# Patient Record
Sex: Male | Born: 1995 | Race: White | Hispanic: No | Marital: Single | Smoking: Never smoker
Health system: Southern US, Community
[De-identification: ages and names within clinical notes are randomized; demographics above are authoritative.]

## PROBLEM LIST (undated history)

## (undated) DIAGNOSIS — C4490 Unspecified malignant neoplasm of skin, unspecified: Secondary | ICD-10-CM

## (undated) DIAGNOSIS — K219 Gastro-esophageal reflux disease without esophagitis: Secondary | ICD-10-CM

## (undated) HISTORY — DX: Unspecified malignant neoplasm of skin, unspecified: C44.90

## (undated) HISTORY — PX: HERNIA REPAIR: SHX51

## (undated) HISTORY — DX: Gastro-esophageal reflux disease without esophagitis: K21.9

## (undated) HISTORY — PX: OTHER SURGICAL HISTORY: SHX169

## (undated) HISTORY — PX: WISDOM TOOTH EXTRACTION: SHX21

---
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
## 2017-02-18 ENCOUNTER — Other Ambulatory Visit: Payer: Self-pay | Admitting: Nurse Practitioner

## 2017-02-18 DIAGNOSIS — N5082 Scrotal pain: Secondary | ICD-10-CM

## 2017-02-22 ENCOUNTER — Ambulatory Visit
Admission: RE | Admit: 2017-02-22 | Discharge: 2017-02-22 | Disposition: A | Discharge status: Home / Self Care | Payer: Medicaid Other | Source: Ambulatory Visit | Attending: Nurse Practitioner | Admitting: Nurse Practitioner

## 2017-02-22 DIAGNOSIS — N500 Atrophy of testis: Secondary | ICD-10-CM | POA: Diagnosis not present

## 2017-02-22 DIAGNOSIS — N503 Cyst of epididymis: Secondary | ICD-10-CM | POA: Insufficient documentation

## 2017-02-22 DIAGNOSIS — N5082 Scrotal pain: Secondary | ICD-10-CM | POA: Diagnosis present

## 2017-02-28 ENCOUNTER — Ambulatory Visit (INDEPENDENT_AMBULATORY_CARE_PROVIDER_SITE_OTHER): Payer: Medicaid Other | Admitting: Urology

## 2017-02-28 VITALS — BP 120/81 | HR 69 | Ht 68.0 in | Wt 135.0 lb

## 2017-02-28 DIAGNOSIS — N500 Atrophy of testis: Secondary | ICD-10-CM | POA: Diagnosis not present

## 2017-02-28 DIAGNOSIS — N503 Cyst of epididymis: Secondary | ICD-10-CM | POA: Diagnosis not present

## 2017-02-28 LAB — MICROSCOPIC EXAMINATION
EPITHELIAL CELLS (NON RENAL): NONE SEEN /HPF (ref 0–10)
RBC, UA: NONE SEEN /hpf (ref 0–?)
WBC UA: NONE SEEN /HPF (ref 0–?)

## 2017-02-28 LAB — URINALYSIS, COMPLETE
BILIRUBIN UA: NEGATIVE
Glucose, UA: NEGATIVE
Ketones, UA: NEGATIVE
Leukocytes, UA: NEGATIVE
NITRITE UA: NEGATIVE
PH UA: 6 (ref 5.0–7.5)
RBC UA: NEGATIVE
Specific Gravity, UA: >1.03 — ABNORMAL HIGH (ref 1.005–1.030)
UUROB: 0.2 mg/dL (ref 0.2–1.0)

## 2017-02-28 NOTE — Progress Notes (Signed)
02/28/2017 9:21 AM   Cole Salinas 12/06/1995 [id number]  Referring provider: [place], [place] [address]  No chief complaint on file.   HPI: The patient is a 21 year old gentleman presents today for evaluation of a 2.5 cm left epididymal cyst. Also of note he does have an atrophic right testicle. 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 He recently has had intermittent discomfort in his left scrotum. Sitting and relaxing makes it better. Active movement makes it worse. He denies any history of infections. He denies dysuria. He does have a history of a right inguinal herniorrhaphy as a young child which likely accounts for his right atrophic testicle.   PMH: Past Medical History:  Diagnosis Date  . GERD (gastroesophageal reflux disease)   . Skin cancer 2011   melanoma -removed by plastic surgeon- cant recall name     Surgical History: Past Surgical History:  Procedure Laterality Date  . caterization of nose     per pt-frquent nose bleeds   . HERNIA REPAIR     " as a baby " per pt-does not know age  . WISDOM TOOTH EXTRACTION      Home Medications:  Allergies as of 02/28/2017   No Known Allergies     Medication List       Accurate as of 02/28/17  9:21 AM. Always use your most recent med list.          BENZACLIN gel Generic drug:  clindamycin-benzoyl peroxide Benzaclin 1 %-5 % topical gel   omeprazole 40 MG capsule Commonly known as:  PRILOSEC take 1 capsule by mouth once daily IN THE MORNING 1 HOUR PRIOR TO BREAKFAST       Allergies: No Known Allergies  Family History: Family History  Problem Relation Age of Onset  . Depression Mother   . Autoimmune disease Mother   . Cancer - Other Brother        " blood cancer " per pt    . Other Brother        "iron problem-too high "per pt (thinks )    Social History:  reports that he has never smoked. He uses smokeless tobacco. He reports that  he does not drink alcohol or use drugs.  ROS: UROLOGY Frequent Urination?: No Hard to postpone urination?: No Burning/pain with urination?: No Get up at night to urinate?: No Leakage of urine?: No Urine stream starts and stops?: No Trouble starting stream?: No Do you have to strain to urinate?: No Blood in urine?: No Urinary tract infection?: No Sexually transmitted disease?: No Injury to kidneys or bladder?: No Painful intercourse?: No Weak stream?: No Erection problems?: No Penile pain?: No  Gastrointestinal Nausea?: No Vomiting?: No Indigestion/heartburn?: No Diarrhea?: No Constipation?: No  Constitutional Fever: No Night sweats?: No Weight loss?: No Fatigue?: No  Skin Skin rash/lesions?: No Itching?: No  Eyes Blurred vision?: No Double vision?: No  Ears/Nose/Throat Sore throat?: No Sinus problems?: No  Hematologic/Lymphatic Swollen glands?: No Easy bruising?: No  Cardiovascular Leg swelling?: No Chest pain?: No  Respiratory Cough?: No Shortness of breath?: No  Endocrine Excessive thirst?: No  Musculoskeletal Back pain?: No Joint pain?: No  Neurological Headaches?: No Dizziness?: No  Psychologic Depression?: No Anxiety?: No  Physical Exam: BP 120/81 (BP Location: Right Arm, Patient Position: Sitting, Cuff Size: Normal)   Pulse 69   Ht 5\' 8"  (1.727 m)   Wt 135 lb (61.2 kg)  BMI 20.53 kg/m   Constitutional:  Alert and oriented, No acute distress. HEENT: [place] AT, moist mucus membranes.  Trachea midline, no masses. Cardiovascular: No clubbing, cyanosis, or edema. Respiratory: Normal respiratory effort, no increased work of breathing. GI: Abdomen is soft, nontender, nondistended, no abdominal masses GU: No CVA tenderness. Normal phallus except for minor glandular hypospadias. Right atrophic testicle. Normal left testicle. Aprroximately 2 cm left epididymal cyst appreciated. Overall benign exam. Cyst is nontender to palpation. Skin: No  rashes, bruises or suspicious lesions. Lymph: No cervical or inguinal adenopathy. Neurologic: Grossly intact, no focal deficits, moving all 4 extremities. Psychiatric: Normal mood and affect.  Laboratory Data: Lab Results  Component Value Date   WBC 6.3 11/29/2016   HGB 16.2 11/29/2016   HCT 45.7 11/29/2016   MCV 85.0 11/29/2016   PLT 256 11/29/2016    Lab Results  Component Value Date   CREATININE 0.93 11/29/2016    No results found for: PSA  No results found for: TESTOSTERONE  No results found for: HGBA1C  Urinalysis No results found for: COLORURINE, APPEARANCEUR, LABSPEC, [place], GLUCOSEU, HGBUR, BILIRUBINUR, KETONESUR, PROTEINUR, UROBILINOGEN, NITRITE, LEUKOCYTESUR  Pertinent Imaging: Scrotal ultrasound reviewed as above  Assessment & Plan:   1. Left epididymal cysts 2. Right atrophic testicle I discussed with the patient that his epididymal cysts is incidental benign finding and that these really need to be treated. I also shared my concern with him that if we did remove his left epididymal cyst that due to his atrophic right testicle that he would be infertile. I strongly recommend against any further workup of this cyst especially due to fertility issues.  I have recommended NSAIDs and icing if he has discomfort in his left hemiscrotum. The patient is agreeable. We'll follow up with [place] as needed.  Return if symptoms worsen or fail to improve.  Nickie Retort, MD  [facility] [address], [place], [place] [address] [phone]

## 2017-05-28 ENCOUNTER — Emergency Department: Payer: Medicaid Other

## 2017-05-28 ENCOUNTER — Emergency Department
Admission: EM | Admit: 2017-05-28 | Discharge: 2017-05-28 | Disposition: A | Discharge status: Home / Self Care | Payer: Medicaid Other | Attending: Emergency Medicine | Admitting: Emergency Medicine

## 2017-05-28 DIAGNOSIS — F909 Attention-deficit hyperactivity disorder, unspecified type: Secondary | ICD-10-CM | POA: Diagnosis not present

## 2017-05-28 DIAGNOSIS — S61233A Puncture wound without foreign body of left middle finger without damage to nail, initial encounter: Secondary | ICD-10-CM | POA: Diagnosis not present

## 2017-05-28 DIAGNOSIS — Z79899 Other long term (current) drug therapy: Secondary | ICD-10-CM | POA: Diagnosis not present

## 2017-05-28 DIAGNOSIS — Y929 Unspecified place or not applicable: Secondary | ICD-10-CM | POA: Insufficient documentation

## 2017-05-28 DIAGNOSIS — W298XXA Contact with other powered powered hand tools and household machinery, initial encounter: Secondary | ICD-10-CM | POA: Diagnosis not present

## 2017-05-28 DIAGNOSIS — T148XXA Other injury of unspecified body region, initial encounter: Secondary | ICD-10-CM

## 2017-05-28 DIAGNOSIS — Z23 Encounter for immunization: Secondary | ICD-10-CM | POA: Diagnosis not present

## 2017-05-28 DIAGNOSIS — Y9389 Activity, other specified: Secondary | ICD-10-CM | POA: Insufficient documentation

## 2017-05-28 DIAGNOSIS — Z85820 Personal history of malignant melanoma of skin: Secondary | ICD-10-CM | POA: Insufficient documentation

## 2017-05-28 DIAGNOSIS — Y999 Unspecified external cause status: Secondary | ICD-10-CM | POA: Insufficient documentation

## 2017-05-28 MED ORDER — NAPROXEN 500 MG PO TABS: 500.0000 mg | ORAL_TABLET | Freq: Two times a day (BID) | ORAL | 0 refills | Status: DC

## 2017-05-28 MED ORDER — BACITRACIN ZINC 500 UNIT/GM EX OINT: TOPICAL_OINTMENT | Freq: Two times a day (BID) | CUTANEOUS | Status: DC

## 2017-05-28 MED ORDER — SULFAMETHOXAZOLE-TRIMETHOPRIM 800-160 MG PO TABS: 1.0000 | ORAL_TABLET | Freq: Two times a day (BID) | ORAL | 0 refills | Status: DC

## 2017-05-28 MED ORDER — TETANUS-DIPHTH-ACELL PERTUSSIS 5-2.5-18.5 LF-MCG/0.5 IM SUSP
0.5000 mL | Freq: Once | INTRAMUSCULAR | Status: AC
  Administered 2017-05-28: 0.5 mL via INTRAMUSCULAR
  Filled 2017-05-28: qty 0.5

## 2017-05-28 NOTE — Discharge Instructions (Signed)
Finger splint for 2-3 days as directed. Take medication as directed.

## 2017-05-28 NOTE — ED Notes (Signed)
FIRST NURSE NOTE: pt ambulatory to ER via POV. Left hand 3rd digit injury, wrapped upon arrival. Pt in NAD.

## 2017-05-28 NOTE — ED Provider Notes (Signed)
[facility] Emergency Department Provider Note   ____________________________________________   First MD Initiated Contact with Patient 05/28/17 1234     (approximate)  I have reviewed the triage vital signs and the nursing notes.   HISTORY  Chief Complaint No chief complaint on file.    HPI Cole Salinas is a 22 y.o. male patient present with entrance and exit puncture wound to the left middle finger. Patient was using a drill when it slipped causingpuncture wound to the proximal phalange of the third digit left hand. Patient denies loss sensation or loss of function of the finger. Patient unsure last tetanus shot. Patient is right-hand dominant.   Past Medical History:  Diagnosis Date  . GERD (gastroesophageal reflux disease)   . Skin cancer 2011   melanoma -removed by plastic surgeon- cant recall name     Patient Active Problem List   Diagnosis Date Noted  . Epididymal cyst 01/04/2015  . Cyst of epididymis 01/04/2015  . 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 Attention and concentration deficit 10/01/2014  . 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 Seasonal allergic rhinitis 03/15/2014    Past Surgical History:  Procedure Laterality Date  . caterization of nose     per pt-frquent nose bleeds   . HERNIA REPAIR     " as a baby " per pt-does not know age  . WISDOM TOOTH EXTRACTION      Prior to Admission medications   Medication Sig Start Date End Date Taking? Authorizing Provider  clindamycin-benzoyl peroxide (BENZACLIN) gel Benzaclin 1 %-5 % topical gel    [provider]  naproxen (NAPROSYN) 500 MG tablet Take 1 tablet (500 mg total) by mouth 2 (two) times daily with a meal. 05/28/17   Sable Feil, PA-C  omeprazole (PRILOSEC) 40 MG capsule take 1 capsule by mouth once daily IN THE MORNING 1 HOUR PRIOR TO BREAKFAST 11/07/16   [provider]  sulfamethoxazole-trimethoprim (BACTRIM  DS,SEPTRA DS) 800-160 MG tablet Take 1 tablet by mouth 2 (two) times daily. 05/28/17   Sable Feil, PA-C    Allergies Patient has no known allergies.  Family History  Problem Relation Age of Onset  . Depression Mother   . Autoimmune disease Mother   . Cancer - Other Brother        " blood cancer " per pt    . Other Brother        "iron problem-too high "per pt (thinks )    Social History Social History   Tobacco Use  . Smoking status: Never Smoker  . Smokeless tobacco: Current User  Substance Use Topics  . Alcohol use: No  . Drug use: No    Review of Systems Constitutional: No fever/chills Eyes: No visual changes. ENT: No sore throat. Cardiovascular: Denies chest pain. Respiratory: Denies shortness of breath. Gastrointestinal: No abdominal pain.  No nausea, no vomiting.  No diarrhea.  No constipation. Genitourinary: Negative for dysuria. Musculoskeletal: Negative for back pain. Skin: Puncture wound third digit left hand Neurological: Negative for headaches, focal weakness or numbness.   ____________________________________________   PHYSICAL EXAM:  VITAL SIGNS: ED Triage Vitals [05/28/17 1220]  Enc Vitals Group     BP (!) 138/91     Pulse Rate 88     Resp 20     Temp 98.2 F (36.8 C)     Temp Source Oral     SpO2 100 %     Weight 135 lb (61.2  kg)     Height 5\' 8"  (1.727 m)     Head Circumference      Peak Flow      Pain Score      Pain Loc      Pain Edu?      Excl. in [place]?    Constitutional: Alert and oriented. Well appearing and in no acute distress. Cardiovascular: Normal rate, regular rhythm. Grossly normal heart sounds.  Good peripheral circulation. Respiratory: Normal respiratory effort.  No retractions. Lungs CTAB. Musculoskeletal: deformity to the third digit left hand. Patient has full nuchal range of motion with flexion and extension. Neurologic:  Normal speech and language. No gross focal neurologic deficits are appreciated. No gait  instability. Skin:  Skin is warm, dry and intact. No rash noted. Entrance exit small puncture wound to the third digit left hand. Psychiatric: Mood and affect are normal. Speech and behavior are normal.  ____________________________________________   LABS (all labs ordered are listed, but only abnormal results are displayed)  Labs Reviewed - No data to display ____________________________________________  EKG   ____________________________________________  RADIOLOGY  Dg Finger Middle Left  Result Date: 05/28/2017 CLINICAL DATA:  Puncture wound with screw EXAM: LEFT MIDDLE FINGER 2+V COMPARISON:  None. FINDINGS: There is no evidence of fracture or dislocation. There is no evidence of arthropathy or other focal bone abnormality. Soft tissues are unremarkable. IMPRESSION: Negative. Electronically Signed   By: Franchot Gallo M.D.   On: 05/28/2017 12:56    ____________________________________________   PROCEDURES  Procedure(s) performed: None  Procedures  Critical Care performed: No  ____________________________________________   INITIAL IMPRESSION / ASSESSMENT AND PLAN / ED COURSE  As part of my medical decision making, I reviewed the following data within the [facility]    Puncture wound third digit left hand. Discussed negative x-ray finding with patient. Patient given discharge care instructions. Patient advised to take medication as directed.      ____________________________________________   FINAL CLINICAL IMPRESSION(S) / ED DIAGNOSES  Final diagnoses:  Puncture wound     ED Discharge Orders        Ordered    naproxen (NAPROSYN) 500 MG tablet  2 times daily with meals     05/28/17 1306    sulfamethoxazole-trimethoprim (BACTRIM DS,SEPTRA DS) 800-160 MG tablet  2 times daily     05/28/17 1306       Note:  This document was prepared using Dragon voice recognition software and may include unintentional dictation errors.    Sable Feil, PA-C 05/28/17 1307    Carrie Mew, MD 05/28/17 1535

## 2017-06-18 ENCOUNTER — Encounter: Payer: Self-pay | Admitting: Oncology

## 2017-06-18 ENCOUNTER — Inpatient Hospital Stay: Payer: Medicaid Other | Attending: Oncology

## 2017-06-18 ENCOUNTER — Inpatient Hospital Stay (HOSPITAL_BASED_OUTPATIENT_CLINIC_OR_DEPARTMENT_OTHER): Payer: Medicaid Other | Admitting: Oncology

## 2017-06-18 LAB — IRON AND TIBC
IRON: 116 ug/dL (ref 45–182)
Saturation Ratios: 37 % (ref 17.9–39.5)
TIBC: 310 ug/dL (ref 250–450)
UIBC: 194 ug/dL

## 2017-06-18 LAB — CBC WITH DIFFERENTIAL/PLATELET
Basophils Absolute: 0.1 10*3/uL (ref 0–0.1)
Basophils Relative: 1 %
EOS PCT: 3 %
Eosinophils Absolute: 0.2 10*3/uL (ref 0–0.7)
HEMATOCRIT: 45.1 % (ref 40.0–52.0)
HEMOGLOBIN: 15.6 g/dL (ref 13.0–18.0)
LYMPHS ABS: 1.9 10*3/uL (ref 1.0–3.6)
LYMPHS PCT: 32 %
MCH: 29.9 pg (ref 26.0–34.0)
MCHC: 34.6 g/dL (ref 32.0–36.0)
MCV: 86.6 fL (ref 80.0–100.0)
Monocytes Absolute: 0.5 10*3/uL (ref 0.2–1.0)
Monocytes Relative: 8 %
NEUTROS ABS: 3.3 10*3/uL (ref 1.4–6.5)
Neutrophils Relative %: 56 %
Platelets: 240 10*3/uL (ref 150–440)
RBC: 5.21 MIL/uL (ref 4.40–5.90)
RDW: 13 % (ref 11.5–14.5)
WBC: 5.9 10*3/uL (ref 3.8–10.6)

## 2017-06-18 LAB — COMPREHENSIVE METABOLIC PANEL
ALK PHOS: 72 U/L (ref 38–126)
ALT: 37 U/L (ref 17–63)
AST: 28 U/L (ref 15–41)
Albumin: 4.5 g/dL (ref 3.5–5.0)
Anion gap: 9 (ref 5–15)
BILIRUBIN TOTAL: 1.2 mg/dL (ref 0.3–1.2)
BUN: 16 mg/dL (ref 6–20)
CALCIUM: 9.2 mg/dL (ref 8.9–10.3)
CO2: 22 mmol/L (ref 22–32)
CREATININE: 0.96 mg/dL (ref 0.61–1.24)
Chloride: 107 mmol/L (ref 101–111)
Glucose, Bld: 98 mg/dL (ref 65–99)
Potassium: 4.2 mmol/L (ref 3.5–5.1)
Sodium: 138 mmol/L (ref 135–145)
TOTAL PROTEIN: 7.1 g/dL (ref 6.5–8.1)

## 2017-06-18 LAB — FERRITIN: FERRITIN: 110 ng/mL (ref 24–336)

## 2017-06-18 LAB — TSH: TSH: 1.242 u[IU]/mL (ref 0.350–4.500)

## 2017-06-18 NOTE — Progress Notes (Signed)
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 His identical twin brother as well as his mother have been found to have hemochromatosis and are under the care of Dr. Grayland Ormond.  They have not required any phlebotomy yet.  Results of blood work from 11/29/2016 were as follows: CBC showed white count of 6.3, H&H of 16.2/45.7 and a platelet count of 256. CMP was normal except for a mildly elevated bilirubin of 1.5. Ferritin was normal at 121. Iron studies showed an elevated iron saturation of 60%. HFE gene testing revealed single mutation in C282Y. Patients grandfather also has [place] and has been getting periodic phlebotomy per patient   Interval history- doing well.  Denies any fatigue, joint pains or joint swelling, problems with sexual function  ECOG PS- 0 Pain scale- 0   Review of systems- Review of Systems  Constitutional: Negative for chills, fever, malaise/fatigue and weight loss.  HENT: Negative for congestion, ear discharge and nosebleeds.   Eyes: Negative for blurred vision.  Respiratory: Negative for cough, hemoptysis, sputum production, shortness of breath and wheezing.   Cardiovascular: Negative for chest pain, palpitations, orthopnea and claudication.  Gastrointestinal: Negative for abdominal pain, blood in stool, constipation, diarrhea, heartburn, melena, nausea and vomiting.  Genitourinary: Negative for dysuria, flank pain, frequency, hematuria and urgency.  Musculoskeletal: Negative for back pain, joint pain and myalgias.  Skin: Negative for  rash.  Neurological: Negative for dizziness, tingling, focal weakness, seizures, weakness and headaches.  Endo/Heme/Allergies: Does not bruise/bleed easily.  Psychiatric/Behavioral: Negative for depression and suicidal ideas. The patient does not have insomnia.      No Known Allergies   Past Medical History:  Diagnosis Date  . GERD (gastroesophageal reflux disease)   . Skin cancer 2011   melanoma -removed by plastic surgeon- cant recall name      Past Surgical History:  Procedure Laterality Date  . caterization of nose     per pt-frquent nose bleeds   . HERNIA REPAIR     " as a baby " per pt-does not know age  . WISDOM TOOTH EXTRACTION      Social History   Socioeconomic History  . Marital status: Single    Spouse name: Not on file  . Number of children: Not on file  . Years of education: Not on file  . Highest education level: Not on file  Social Needs  . Financial resource strain: Not on file  . Food insecurity - worry: Not on file  . Food insecurity - inability: Not on file  . Transportation needs - medical: Not on file  . Transportation needs - non-medical: Not on file  Occupational History  . Not on file  Tobacco Use  . Smoking status: Never Smoker  . Smokeless tobacco: Current User  Substance and Sexual Activity  . Alcohol use: No  . Drug use: No  . Sexual activity: Yes  Other Topics Concern  . Not on file  Social History Narrative  . Not on file    Family History  Problem Relation Age of Onset  . Depression Mother   .  Autoimmune disease Mother   . Cancer - Other Brother        " blood cancer " per pt    . Other Brother        "iron problem-too high "per pt (thinks )     Current Outpatient Medications:  .  clindamycin-benzoyl peroxide (BENZACLIN) gel, Benzaclin 1 %-5 % topical gel, Disp: , Rfl:  .  naproxen (NAPROSYN) 500 MG tablet, Take 1 tablet (500 mg total) by mouth 2 (two) times daily with a meal., Disp: 20 tablet, Rfl: 0 .  omeprazole  (PRILOSEC) 40 MG capsule, take 1 capsule by mouth once daily IN THE MORNING 1 HOUR PRIOR TO BREAKFAST, Disp: , Rfl: 0 .  sulfamethoxazole-trimethoprim (BACTRIM DS,SEPTRA DS) 800-160 MG tablet, Take 1 tablet by mouth 2 (two) times daily., Disp: 20 tablet, Rfl: 0  Physical exam:  Vitals:   06/18/17 1149  BP: 121/78  Pulse: 79  Resp: 14  Temp: (!) 97.3 F (36.3 C)  TempSrc: Tympanic  Weight: 138 lb (62.6 kg)   Physical Exam  Constitutional: He is oriented to person, place, and time and well-developed, well-nourished, and in no distress.  HENT:  Head: Normocephalic and atraumatic.  Eyes: EOM are normal. Pupils are equal, round, and reactive to light.  Neck: Normal range of motion.  Cardiovascular: Normal rate, regular rhythm and normal heart sounds.  Pulmonary/Chest: Effort normal and breath sounds normal.  Abdominal: Soft. Bowel sounds are normal.  Musculoskeletal:  No evidence of joint swelling  Neurological: He is alert and oriented to person, place, and time.  Skin: Skin is warm and dry.     CMP Latest Ref Rng & Units 06/18/2017  Glucose 65 - 99 mg/dL 98  BUN 6 - 20 mg/dL 16  Creatinine 0.61 - 1.24 mg/dL 0.96  Sodium 135 - 145 mmol/L 138  Potassium 3.5 - 5.1 mmol/L 4.2  Chloride 101 - 111 mmol/L 107  CO2 22 - 32 mmol/L 22  Calcium 8.9 - 10.3 mg/dL 9.2  Total Protein 6.5 - 8.1 g/dL 7.1  Total Bilirubin 0.3 - 1.2 mg/dL 1.2  Alkaline Phos 38 - 126 U/L 72  AST 15 - 41 U/L 28  ALT 17 - 63 U/L 37   CBC Latest Ref Rng & Units 06/18/2017  WBC 3.8 - 10.6 K/uL 5.9  Hemoglobin 13.0 - 18.0 g/dL 15.6  Hematocrit 40.0 - 52.0 % 45.1  Platelets 150 - 440 K/uL 240    No images are attached to the encounter.  Dg Finger Middle Left  Result Date: 05/28/2017 CLINICAL DATA:  Puncture wound with screw EXAM: LEFT MIDDLE FINGER 2+V COMPARISON:  None. FINDINGS: There is no evidence of fracture or dislocation. There is no evidence of arthropathy or other focal bone abnormality. Soft tissues  are unremarkable. IMPRESSION: Negative. Electronically Signed   By: Franchot Gallo M.D.   On: 05/28/2017 12:56     Assessment and plan- Patient is a 22 y.o. 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  Ferritin levels are pending.  In the past patient has had an elevated iron saturation of 60% but ferritin levels were normal at 121.  Clinically patient does not have any symptoms of iron overload.  I will repeat CBC and CMP along with iron studies in 6 months in 1 year and I will see him back in 1 year   Visit Diagnosis 1. Hereditary hemochromatosis ([place])      Dr. Randa Evens,  MD, MPH [place] [facility] Pager- [phone] 06/18/2017 12:14 PM

## 2017-09-08 ENCOUNTER — Ambulatory Visit
Admission: EM | Admit: 2017-09-08 | Discharge: 2017-09-08 | Disposition: A | Discharge status: Home / Self Care | Payer: Medicaid Other | Attending: Emergency Medicine | Admitting: Emergency Medicine

## 2017-09-08 DIAGNOSIS — J069 Acute upper respiratory infection, unspecified: Secondary | ICD-10-CM

## 2017-09-08 MED ORDER — GUAIFENESIN-CODEINE 100-10 MG/5ML PO SYRP: 5.0000 mL | ORAL_SOLUTION | Freq: Three times a day (TID) | ORAL | 0 refills | Status: DC | PRN

## 2017-09-08 MED ORDER — FEXOFENADINE HCL 180 MG PO TABS: 180.0000 mg | ORAL_TABLET | Freq: Every day | ORAL | 0 refills | Status: DC

## 2017-09-08 MED ORDER — FLUTICASONE PROPIONATE 50 MCG/ACT NA SUSP: 2.0000 | Freq: Every day | NASAL | 0 refills | Status: DC

## 2017-09-08 MED ORDER — BENZONATATE 200 MG PO CAPS: ORAL_CAPSULE | ORAL | 0 refills | Status: DC

## 2017-09-08 NOTE — ED Triage Notes (Signed)
Pt here for cough and sore throat. Has been hurting since Thursday. No reports of fever and no otc medications tried.

## 2017-09-08 NOTE — ED Provider Notes (Signed)
[facility]    CSN: [id number] Arrival date & time: 09/08/17  1334     History   Chief Complaint Chief Complaint  Patient presents with  . Cough    HPI Cole Salinas is a 22 y.o. male.   HPI  21 year old male presents with cough and sore throat has had since Thursday 3 days prior to this visit.  He has had no fever or chills.  He is not tried any over-the-counter medications.  He has had a stuffy nose.  He is a non-smoker but does vape.  States his boss and his girlfriend have both had very similar symptoms.  He had a difficult time sleeping last night due to the cough.        Past Medical History:  Diagnosis Date  . GERD (gastroesophageal reflux disease)   . Skin cancer 2011   melanoma -removed by plastic surgeon- cant recall name     Patient Active Problem List   Diagnosis Date Noted  . Epididymal cyst 01/04/2015  . Cyst of epididymis 01/04/2015  . 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 Attention and concentration deficit 10/01/2014  . 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 Seasonal allergic rhinitis 03/15/2014    Past Surgical History:  Procedure Laterality Date  . caterization of nose     per pt-frquent nose bleeds   . HERNIA REPAIR     " as a baby " per pt-does not know age  . WISDOM TOOTH EXTRACTION         Home Medications    Prior to Admission medications   Medication Sig Start Date End Date Taking? Authorizing Provider  clindamycin-benzoyl peroxide (BENZACLIN) gel Benzaclin 1 %-5 % topical gel   Yes [provider]  omeprazole (PRILOSEC) 40 MG capsule take 1 capsule by mouth once daily IN THE MORNING 1 HOUR PRIOR TO BREAKFAST 11/07/16  Yes [provider]  benzonatate (TESSALON) 200 MG capsule Take one cap TID PRN cough 09/08/17   Lorin Picket, PA-C  fexofenadine (ALLEGRA ALLERGY) 180 MG tablet Take 1 tablet (180 mg total) by mouth daily. 09/08/17   Lorin Picket, PA-C    fluticasone (FLONASE) 50 MCG/ACT nasal spray Place 2 sprays into both nostrils daily. 09/08/17   Lorin Picket, PA-C  guaiFENesin-codeine (CHERATUSSIN AC) 100-10 MG/5ML syrup Take 5 mLs by mouth 3 (three) times daily as needed for cough. 09/08/17   Lorin Picket, PA-C    Family History Family History  Problem Relation Age of Onset  . Depression Mother   . Autoimmune disease Mother   . Cancer - Other Brother        " blood cancer " per pt    . Other Brother        "iron problem-too high "per pt (thinks )    Social History Social History   Tobacco Use  . Smoking status: Never Smoker  . Smokeless tobacco: Current User  Substance Use Topics  . Alcohol use: No  . Drug use: No     Allergies   Patient has no known allergies.   Review of Systems Review of Systems  Constitutional: Positive for activity change. Negative for chills, fatigue and fever.  HENT: Positive for congestion, postnasal drip, rhinorrhea, sinus pressure, sinus pain and sore throat.   All other systems reviewed and are negative.    Physical Exam Triage Vital Signs ED Triage Vitals  Enc Vitals Group     BP 09/08/17 1344 130/79  Pulse Rate 09/08/17 1344 88     Resp 09/08/17 1344 18     Temp 09/08/17 1344 98.4 F (36.9 C)     Temp Source 09/08/17 1344 Oral     SpO2 09/08/17 1344 100 %     Weight --      Height --      Head Circumference --      Peak Flow --      Pain Score 09/08/17 1345 4     Pain Loc --      Pain Edu? --      Excl. in [place]? --    No data found.  Updated Vital Signs BP 130/79 (BP Location: Right Arm)   Pulse 88   Temp 98.4 F (36.9 C) (Oral)   Resp 18   SpO2 100%   Visual Acuity Right Eye Distance:   Left Eye Distance:   Bilateral Distance:    Right Eye Near:   Left Eye Near:    Bilateral Near:     Physical Exam  Constitutional: He is oriented to person, place, and time. He appears well-developed and well-nourished. No distress.  HENT:  Head:  Normocephalic.  Right Ear: External ear normal.  Left Ear: External ear normal.  Nose: Nose normal.  Mouth/Throat: Oropharynx is clear and moist. No oropharyngeal exudate.  Eyes: Pupils are equal, round, and reactive to light. Right eye exhibits no discharge. Left eye exhibits no discharge.  Neck: Normal range of motion.  Pulmonary/Chest: Effort normal and breath sounds normal.  Musculoskeletal: Normal range of motion.  Lymphadenopathy:    He has no cervical adenopathy.  Neurological: He is alert and oriented to person, place, and time.  Skin: Skin is warm and dry. He is not diaphoretic.  Psychiatric: He has a normal mood and affect. His behavior is normal. Judgment and thought content normal.  Nursing note and vitals reviewed.    UC Treatments / Results  Labs (all labs ordered are listed, but only abnormal results are displayed) Labs Reviewed - No data to display  EKG None Radiology No results found.  Procedures Procedures (including critical care time)  Medications Ordered in UC Medications - No data to display   Initial Impression / Assessment and Plan / UC Course  I have reviewed the triage vital signs and the nursing notes.  Pertinent labs & imaging results that were available during my care of the patient were reviewed by me and considered in my medical decision making (see chart for details).     Plan: 1. Test/x-ray results and diagnosis reviewed with patient 2. rx as per orders; risks, benefits, potential side effects reviewed with patient 3. Recommend supportive treatment with rest and fluids.  Treat with cough suppressants.  Because his job entails working on [employer] most of the day I have cautioned him to not use the Cheratussin during working hours.  He should also not drive while taking the medication.  If he is not improving or worsens he should follow-up with his primary care physician. 4. F/u prn if symptoms worsen or don't improve   Final Clinical  Impressions(s) / UC Diagnoses   Final diagnoses:  Upper respiratory tract infection, unspecified type    ED Discharge Orders        Ordered    fluticasone (FLONASE) 50 MCG/ACT nasal spray  Daily     09/08/17 1408    benzonatate (TESSALON) 200 MG capsule     09/08/17 1408    guaiFENesin-codeine (CHERATUSSIN AC) 100-10  MG/5ML syrup  3 times daily PRN     09/08/17 1408    fexofenadine (ALLEGRA ALLERGY) 180 MG tablet  Daily     09/08/17 1408       Controlled Substance Prescriptions [place] Controlled Substance Registry consulted? Not Applicable   Lorin Picket, PA-C 09/08/17 1418

## 2017-11-20 ENCOUNTER — Ambulatory Visit: Payer: Medicaid Other | Admitting: Gastroenterology

## 2017-11-20 ENCOUNTER — Encounter: Payer: Self-pay | Admitting: Gastroenterology

## 2017-11-20 ENCOUNTER — Other Ambulatory Visit
Admission: RE | Admit: 2017-11-20 | Discharge: 2017-11-20 | Disposition: A | Discharge status: Home / Self Care | Payer: Medicaid Other | Source: Ambulatory Visit | Attending: Gastroenterology | Admitting: Gastroenterology

## 2017-11-20 ENCOUNTER — Encounter (INDEPENDENT_AMBULATORY_CARE_PROVIDER_SITE_OTHER): Payer: Self-pay

## 2017-11-20 VITALS — BP 110/68 | HR 87 | Ht 68.0 in | Wt 143.2 lb

## 2017-11-20 DIAGNOSIS — R1013 Epigastric pain: Secondary | ICD-10-CM | POA: Diagnosis not present

## 2017-11-20 MED ORDER — OMEPRAZOLE 40 MG PO CPDR: DELAYED_RELEASE_CAPSULE | ORAL | 0 refills | Status: DC

## 2017-11-20 NOTE — Progress Notes (Signed)
Jonathon Bellows MD, MRCP(U.K) [address]  [place]  [place], [place] [address]  Main: [phone]  Fax: [phone]   Gastroenterology Consultation  Referring Provider:     Danelle Berry, NP Primary Care Physician:  [place] Primary Gastroenterologist:  Dr. Jonathon Bellows  Reason for Consultation:     Abdominal pain.         HPI:   Cole Salinas is a 22 y.o. y/o male referred for consultation & management  by Dr. [facility]     Abdominal pain: Onset: Few years, on and off, lasts for a few months, then has none then returns. On and off during the day  Site :Describes it as a burning sensatrion all over the belly  Radiation: localized.  Severity :mild - affects eating  Petra Kuba of pain: burning  Aggravating factors: eating - begins as soon as he is done with his meals - lasts till he throws up  Relieving factors :nothing  Weight loss: up and down  NSAID use: no , no THC  PPI use :not taking  Gall bladder surgery: no  Frequency of bowel movements: daily upto 3 times day  Change in bowel movements: no  Relief with bowel movements: no  Gas/Bloating/Abdominal distension: no .    Past Medical History:  Diagnosis Date  . GERD (gastroesophageal reflux disease)   . Skin cancer 2011   melanoma -removed by plastic surgeon- cant recall name     Past Surgical History:  Procedure Laterality Date  . caterization of nose     per pt-frquent nose bleeds   . HERNIA REPAIR     " as a baby " per pt-does not know age  . WISDOM TOOTH EXTRACTION      Prior to Admission medications   Medication Sig Start Date End Date Taking? Authorizing Provider  omeprazole (PRILOSEC) 40 MG capsule take 1 capsule by mouth once daily IN THE MORNING 1 HOUR PRIOR TO BREAKFAST 11/07/16  Yes [provider]  benzonatate (TESSALON) 200 MG capsule Take one cap TID PRN cough Patient not taking: Reported on 11/20/2017 09/08/17   Lorin Picket, PA-C  clindamycin-benzoyl  peroxide (BENZACLIN) gel Benzaclin 1 %-5 % topical gel    [provider]  fexofenadine (ALLEGRA ALLERGY) 180 MG tablet Take 1 tablet (180 mg total) by mouth daily. Patient not taking: Reported on 11/20/2017 09/08/17   Crecencio Mc P, PA-C  fluticasone [facility]) 50 MCG/ACT nasal spray Place 2 sprays into both nostrils daily. Patient not taking: Reported on 11/20/2017 09/08/17   Lorin Picket, PA-C  guaiFENesin-codeine (CHERATUSSIN AC) 100-10 MG/5ML syrup Take 5 mLs by mouth 3 (three) times daily as needed for cough. Patient not taking: Reported on 11/20/2017 09/08/17   Lorin Picket, PA-C    Family History  Problem Relation Age of Onset  . Depression Mother   . Autoimmune disease Mother   . Cancer - Other Brother        " blood cancer " per pt    . Other Brother        "iron problem-too high "per pt (thinks )     Social History   Tobacco Use  . Smoking status: Never Smoker  . Smokeless tobacco: Current User  Substance Use Topics  . Alcohol use: No  . Drug use: No    Allergies as of 11/20/2017  . (No Known Allergies)    Review of Systems:    All systems reviewed and negative  except where noted in HPI.   Physical Exam:  BP 110/68   Pulse 87   Ht 5\' 8"  (1.727 m)   Wt 143 lb 3.2 oz (65 kg)   BMI 21.77 kg/m  No LMP for male patient. Psych:  Alert and cooperative. Normal mood and affect. General:   Alert,  Well-developed, well-nourished, pleasant and cooperative in NAD Head:  Normocephalic and atraumatic. Eyes:  Sclera clear, no icterus.   Conjunctiva pink. Ears:  Normal auditory acuity. Nose:  No deformity, discharge, or lesions. Mouth:  No deformity or lesions,oropharynx pink & moist. Neck:  Supple; no masses or thyromegaly. Lungs:  Respirations even and unlabored.  Clear throughout to auscultation.   No wheezes, crackles, or rhonchi. No acute distress. Heart:  Regular rate and rhythm; no murmurs, clicks, rubs, or gallops. Abdomen:  Normal bowel sounds.   No bruits.  Soft, non-tender and non-distended without masses, hepatosplenomegaly or hernias noted.  No guarding or rebound tenderness.    Neurologic:  Alert and oriented x3;  grossly normal neurologically. Skin:  Intact without significant lesions or rashes. No jaundice. Lymph Nodes:  No significant cervical adenopathy. Psych:  Alert and cooperative. Normal mood and affect.  Imaging Studies: No results found.  Assessment and Plan:   Zymir Napoli is a 22 y.o. y/o male has been referred for abdominal pain- long standing - suggestive of dyspepsia.   Plan  1. Stool H pylori antigen  2. Trial pf PPI 3. If no better then will need EGD.   Follow up in 4 weeks  Dr Jonathon Bellows MD,MRCP(U.K)

## 2017-12-14 ENCOUNTER — Ambulatory Visit
Admission: EM | Admit: 2017-12-14 | Discharge: 2017-12-14 | Disposition: A | Discharge status: Home / Self Care | Payer: Medicaid Other | Attending: Family Medicine | Admitting: Family Medicine

## 2017-12-14 ENCOUNTER — Other Ambulatory Visit: Payer: Self-pay

## 2017-12-14 ENCOUNTER — Encounter: Payer: Self-pay | Admitting: Emergency Medicine

## 2017-12-14 DIAGNOSIS — K219 Gastro-esophageal reflux disease without esophagitis: Secondary | ICD-10-CM | POA: Diagnosis not present

## 2017-12-14 DIAGNOSIS — J069 Acute upper respiratory infection, unspecified: Secondary | ICD-10-CM | POA: Diagnosis not present

## 2017-12-14 DIAGNOSIS — J029 Acute pharyngitis, unspecified: Secondary | ICD-10-CM | POA: Insufficient documentation

## 2017-12-14 DIAGNOSIS — Z85820 Personal history of malignant melanoma of skin: Secondary | ICD-10-CM | POA: Insufficient documentation

## 2017-12-14 DIAGNOSIS — J302 Other seasonal allergic rhinitis: Secondary | ICD-10-CM | POA: Diagnosis not present

## 2017-12-14 LAB — RAPID STREP SCREEN (MED CTR MEBANE ONLY): Streptococcus, Group A Screen (Direct): NEGATIVE

## 2017-12-14 MED ORDER — BENZONATATE 100 MG PO CAPS: 100.0000 mg | ORAL_CAPSULE | Freq: Three times a day (TID) | ORAL | 0 refills | Status: AC | PRN

## 2017-12-14 NOTE — ED Triage Notes (Signed)
Patient c/o sore throat and headache that started 2 days ago.

## 2017-12-14 NOTE — Discharge Instructions (Addendum)
Take medication as prescribed. Rest. Drink plenty of fluids. Take over the counter sudafed and afrin as discussed.   Follow up with your primary care physician this week as needed. Return to Urgent care for new or worsening concerns.

## 2017-12-14 NOTE — ED Provider Notes (Signed)
[facility] ____________________________________________  Time seen: Approximately 1:15 PM  I have reviewed the triage vital signs and the nursing notes.   HISTORY  Chief Complaint Sore Throat   HPI Cole Salinas is a 22 y.o. male presented for evaluation of 2 days of sore throat, runny nose, nasal congestion and some cough.  States sore throat is currently mild.  States his fianc was sick with similar just prior to his symptom onset.  Has continue to remain active.  Has not taken any over-the-counter medications for the same complaints.  Denies known fever.  Denies other aggravating or alleviating factors.  Reports otherwise feels well. Denies recent sickness. Denies recent antibiotic use.   [place]: PCP   Past Medical History:  Diagnosis Date  . GERD (gastroesophageal reflux disease)   . Skin cancer 2011   melanoma -removed by plastic surgeon- cant recall name     Patient Active Problem List   Diagnosis Date Noted  . Strain of quadriceps tendon 07/10/2016  . Thigh pain 07/10/2016  . Epididymal cyst 01/04/2015  . Cyst of epididymis 01/04/2015  . 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 Attention and concentration deficit 10/01/2014  . Attention deficit hyperactivity disorder (ADHD), predominantly inattentive type 03/15/2014  . Seasonal allergic rhinitis 03/15/2014    Past Surgical History:  Procedure Laterality Date  . caterization of nose     per pt-frquent nose bleeds   . HERNIA REPAIR     " as a baby " per pt-does not know age  . WISDOM TOOTH EXTRACTION       No current facility-administered medications for this encounter.   Current Outpatient Medications:  .  benzonatate (TESSALON PERLES) 100 MG capsule, Take 1 capsule (100 mg total) by mouth 3 (three) times daily as needed for cough., Disp: 15 capsule, Rfl: 0  Allergies Patient has no known allergies.  Family History  Problem Relation Age of Onset  . Depression Mother   . Autoimmune  disease Mother   . Cancer - Other Brother        " blood cancer " per pt    . Other Brother        "iron problem-too high "per pt (thinks )    Social History Social History   Tobacco Use  . Smoking status: Never Smoker  . Smokeless tobacco: Current User  Substance Use Topics  . Alcohol use: No  . Drug use: No    Review of Systems Constitutional: No fever ENT: As above. Cardiovascular: Denies chest pain. Respiratory: Denies shortness of breath. Gastrointestinal: No abdominal pain.   Musculoskeletal: Negative for back pain. Skin: Negative for rash.  ____________________________________________   PHYSICAL EXAM:  VITAL SIGNS: ED Triage Vitals  Enc Vitals Group     BP 12/14/17 1208 (!) 125/51     Pulse Rate 12/14/17 1208 80     Resp 12/14/17 1208 16     Temp 12/14/17 1208 99.2 F (37.3 C)     Temp Source 12/14/17 1208 Oral     SpO2 12/14/17 1208 99 %     Weight 12/14/17 1206 135 lb (61.2 kg)     Height 12/14/17 1206 5\' 7"  (1.702 m)     Head Circumference --      Peak Flow --      Pain Score 12/14/17 1205 4     Pain Loc --      Pain Edu? --      Excl. in [place]? --    Constitutional: Alert  and oriented. Well appearing and in no acute distress. Eyes: Conjunctivae are normal.  Head: Atraumatic. No sinus tenderness to palpation. No swelling. No erythema.  Ears: no erythema, normal TMs bilaterally.   Nose:Nasal congestion  Mouth/Throat: Mucous membranes are moist. Mild pharyngeal erythema. No tonsillar swelling or exudate.  Neck: No stridor.  No cervical spine tenderness to palpation. Hematological/Lymphatic/Immunilogical: No cervical lymphadenopathy. Cardiovascular: Normal rate, regular rhythm. Grossly normal heart sounds.  Good peripheral circulation. Respiratory: Normal respiratory effort.  No retractions. No wheezes, rales or rhonchi. Good air movement.  Musculoskeletal: Ambulatory with steady gait.  Neurologic:  Normal speech and language. No gait  instability. Skin:  Skin appears warm, dry and intact. No rash noted. Psychiatric: Mood and affect are normal. Speech and behavior are normal.  ___________________________________________   LABS (all labs ordered are listed, but only abnormal results are displayed)  Labs Reviewed  RAPID STREP SCREEN ([facility] ONLY)  CULTURE, GROUP A STREP [facility])   PROCEDURES Procedures   INITIAL IMPRESSION / ASSESSMENT AND PLAN / ED COURSE  Pertinent labs & imaging results that were available during my care of the patient were reviewed by me and considered in my medical decision making (see chart for details).  Well appearing patient.  No acute distress.  Quick strep negative, will culture.  Suspect viral upper will treat with PRN Tessalon Perles, over-the-counter Sudafed and over-the-counter as needed Afrin.  Encourage rest, fluids, supportive care. Discussed indication, risks and benefits of medications with patient.  Discussed follow up with Primary care physician this week. Discussed follow up and return parameters including no resolution or any worsening concerns. Patient verbalized understanding and agreed to plan.   ____________________________________________   FINAL CLINICAL IMPRESSION(S) / ED DIAGNOSES  Final diagnoses:  Upper respiratory tract infection, unspecified type     ED Discharge Orders        Ordered    benzonatate (TESSALON PERLES) 100 MG capsule  3 times daily PRN     12/14/17 1311       Note: This dictation was prepared with Dragon dictation along with smaller phrase technology. Any transcriptional errors that result from this process are unintentional.         Marylene Land, NP 12/14/17 1317

## 2017-12-16 ENCOUNTER — Inpatient Hospital Stay: Payer: Medicaid Other | Attending: Oncology

## 2017-12-16 LAB — IRON AND TIBC
Iron: 82 ug/dL (ref 45–182)
SATURATION RATIOS: 30 % (ref 17.9–39.5)
TIBC: 276 ug/dL (ref 250–450)
UIBC: 194 ug/dL

## 2017-12-16 LAB — CBC WITH DIFFERENTIAL/PLATELET
BASOS ABS: 0 10*3/uL (ref 0–0.1)
BASOS PCT: 1 %
Eosinophils Absolute: 0.2 10*3/uL (ref 0–0.7)
Eosinophils Relative: 3 %
HEMATOCRIT: 45.1 % (ref 40.0–52.0)
HEMOGLOBIN: 15.9 g/dL (ref 13.0–18.0)
LYMPHS PCT: 38 %
Lymphs Abs: 1.8 10*3/uL (ref 1.0–3.6)
MCH: 30.7 pg (ref 26.0–34.0)
MCHC: 35.2 g/dL (ref 32.0–36.0)
MCV: 87 fL (ref 80.0–100.0)
MONO ABS: 0.5 10*3/uL (ref 0.2–1.0)
Monocytes Relative: 11 %
NEUTROS ABS: 2.2 10*3/uL (ref 1.4–6.5)
NEUTROS PCT: 47 %
Platelets: 211 10*3/uL (ref 150–440)
RBC: 5.18 MIL/uL (ref 4.40–5.90)
RDW: 12.7 % (ref 11.5–14.5)
WBC: 4.8 10*3/uL (ref 3.8–10.6)

## 2017-12-16 LAB — COMPREHENSIVE METABOLIC PANEL
ALBUMIN: 4.3 g/dL (ref 3.5–5.0)
ALT: 29 U/L (ref 0–44)
ANION GAP: 9 (ref 5–15)
AST: 28 U/L (ref 15–41)
Alkaline Phosphatase: 70 U/L (ref 38–126)
BILIRUBIN TOTAL: 0.9 mg/dL (ref 0.3–1.2)
BUN: 13 mg/dL (ref 6–20)
CALCIUM: 9.1 mg/dL (ref 8.9–10.3)
CO2: 21 mmol/L — ABNORMAL LOW (ref 22–32)
Chloride: 105 mmol/L (ref 98–111)
Creatinine, Ser: 1.02 mg/dL (ref 0.61–1.24)
GFR calc non Af Amer: >60 mL/min (ref >60)
GLUCOSE: 149 mg/dL — AB (ref 70–99)
POTASSIUM: 3.8 mmol/L (ref 3.5–5.1)
Sodium: 135 mmol/L (ref 135–145)
TOTAL PROTEIN: 7.5 g/dL (ref 6.5–8.1)

## 2017-12-16 LAB — FERRITIN: Ferritin: 158 ng/mL (ref 24–336)

## 2017-12-17 LAB — CULTURE, GROUP A STREP (THRC)

## 2017-12-30 ENCOUNTER — Encounter: Payer: Self-pay | Admitting: Gastroenterology

## 2017-12-30 ENCOUNTER — Ambulatory Visit: Payer: Medicaid Other | Admitting: Gastroenterology

## 2018-01-19 ENCOUNTER — Ambulatory Visit: Payer: Medicaid Other

## 2018-01-19 ENCOUNTER — Ambulatory Visit
Admission: EM | Admit: 2018-01-19 | Discharge: 2018-01-19 | Disposition: A | Discharge status: Home / Self Care | Payer: Medicaid Other | Attending: Family Medicine | Admitting: Family Medicine

## 2018-01-19 DIAGNOSIS — M25572 Pain in left ankle and joints of left foot: Secondary | ICD-10-CM | POA: Diagnosis present

## 2018-01-19 DIAGNOSIS — S90212A Contusion of left great toe with damage to nail, initial encounter: Secondary | ICD-10-CM

## 2018-01-19 DIAGNOSIS — S91112A Laceration without foreign body of left great toe without damage to nail, initial encounter: Secondary | ICD-10-CM | POA: Diagnosis not present

## 2018-01-19 DIAGNOSIS — S91212A Laceration without foreign body of left great toe with damage to nail, initial encounter: Secondary | ICD-10-CM | POA: Diagnosis not present

## 2018-01-19 DIAGNOSIS — X58XXXA Exposure to other specified factors, initial encounter: Secondary | ICD-10-CM | POA: Insufficient documentation

## 2018-01-19 DIAGNOSIS — W208XXA Other cause of strike by thrown, projected or falling object, initial encounter: Secondary | ICD-10-CM

## 2018-01-19 MED ORDER — BUPIVACAINE HCL 0.25 % IJ SOLN
5.0000 mL | Freq: Once | INTRAMUSCULAR | Status: AC
  Administered 2018-01-19: 5 mL

## 2018-01-19 MED ORDER — CEPHALEXIN 500 MG PO CAPS: 500.0000 mg | ORAL_CAPSULE | Freq: Three times a day (TID) | ORAL | 0 refills | Status: AC

## 2018-01-19 MED ORDER — MUPIROCIN 2 % EX OINT: TOPICAL_OINTMENT | CUTANEOUS | 0 refills | Status: AC

## 2018-01-19 NOTE — ED Provider Notes (Signed)
[facility] ____________________________________________  Time seen: Approximately 2:41 PM  I have reviewed the triage vital signs and the nursing notes.   HISTORY  Chief Complaint Foot Injury   HPI Cole Salinas is a 22 y.o. male presenting for evaluation of left great toe pain post injury that occurred just prior to arrival.  Reports that he was helping move furniture and accidentally dropped a bench on the end of his left great toe causing pain and injury.  Reports has been bleeding since injury.  No alleviating measures attempted.  States painful to touch.  Reports tetanus immunization is up-to-date last received a few months ago.  Has continue to remain ambulatory.  Denies pain radiation.  Decreased movement great toe.  Denies other aggravating or alleviating factors. Denies recent sickness. Denies recent antibiotic use.   [facility]: PCP   Past Medical History:  Diagnosis Date  . GERD (gastroesophageal reflux disease)   . Skin cancer 2011   melanoma -removed by plastic surgeon- cant recall name     Patient Active Problem List   Diagnosis Date Noted  . Strain of quadriceps tendon 07/10/2016  . Thigh pain 07/10/2016  . Epididymal cyst 01/04/2015  . Cyst of epididymis 01/04/2015  . 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 Attention and concentration deficit 10/01/2014  . Attention deficit hyperactivity disorder (ADHD), predominantly inattentive type 03/15/2014  . Seasonal allergic rhinitis 03/15/2014    Past Surgical History:  Procedure Laterality Date  . caterization of nose     per pt-frquent nose bleeds   . HERNIA REPAIR     " as a baby " per pt-does not know age  . WISDOM TOOTH EXTRACTION       No current facility-administered medications for this encounter.   Current Outpatient Medications:  .  benzonatate (TESSALON PERLES) 100 MG capsule, Take 1 capsule (100 mg total) by mouth 3 (three) times daily as needed for cough., Disp: 15 capsule,  Rfl: 0 .  cephALEXin (KEFLEX) 500 MG capsule, Take 1 capsule (500 mg total) by mouth 3 (three) times daily for 7 days., Disp: 21 capsule, Rfl: 0 .  mupirocin ointment (BACTROBAN) 2 %, Apply two times a day for 7 days., Disp: 22 g, Rfl: 0  Allergies Patient has no known allergies.  Family History  Problem Relation Age of Onset  . Depression Mother   . Autoimmune disease Mother   . Cancer - Other Brother        " blood cancer " per pt    . Other Brother        "iron problem-too high "per pt (thinks )    Social History Social History   Tobacco Use  . Smoking status: Never Smoker  . Smokeless tobacco: Current User  Substance Use Topics  . Alcohol use: No  . Drug use: No    Review of Systems Constitutional: No fever/chills Cardiovascular: Denies chest pain. Respiratory: Denies shortness of breath. Gastrointestinal: No abdominal pain.   Skin: As above.   ____________________________________________   PHYSICAL EXAM:  VITAL SIGNS: ED Triage Vitals  Enc Vitals Group     BP 01/19/18 1416 (!) 141/84     Pulse Rate 01/19/18 1416 91     Resp 01/19/18 1416 18     Temp 01/19/18 1416 98.2 F (36.8 C)     Temp Source 01/19/18 1416 Oral     SpO2 01/19/18 1416 98 %     Weight 01/19/18 1415 140 lb (63.5 kg)  Height --      Head Circumference --      Peak Flow --      Pain Score 01/19/18 1415 8     Pain Loc --      Pain Edu? --      Excl. in [place]? --     Constitutional: Alert and oriented. Well appearing and in no acute distress. Eyes: Conjunctivae are normal.  ENT      Head: Normocephalic and atraumatic. Cardiovascular: Normal rate, regular rhythm. Grossly normal heart sounds.  Good peripheral circulation. Respiratory: Normal respiratory effort without tachypnea nor retractions. Breath sounds are clear and equal bilaterally. No wheezes, rales, rhonchi. Musculoskeletal: Bilateral pedal pulses equal and easily palpated. Neurologic:  Normal speech and language. No gross  focal neurologic deficits are appreciated. Speech is normal. No gait instability.  Skin:  Skin is warm, dry.      Except: Left great toe moderate tenderness to palpation distal toe with mild active bleeding at lateral aspect of proximal nail where cuticle nailbed is fully exposed on the left side, toenail still attached to the right side,1 cm laceration at distal toenail edge, normal distal sensation, left foot otherwise nontender. Psychiatric: Mood and affect are normal. Speech and behavior are normal. Patient exhibits appropriate insight and judgment   ___________________________________________   LABS (all labs ordered are listed, but only abnormal results are displayed)  Labs Reviewed - No data to display   RADIOLOGY  Dg Toe Great Left  Result Date: 01/19/2018 CLINICAL DATA:  Crushing injury to end of left great toe EXAM: LEFT GREAT TOE COMPARISON:  None. FINDINGS: There is no evidence of fracture or dislocation. There is no evidence of arthropathy or other focal bone abnormality. Soft tissues are unremarkable. IMPRESSION: Negative. Electronically Signed   By: Nolon Nations M.D.   On: 01/19/2018 14:54   ____________________________________________   PROCEDURES Procedures   Procedure(s) performed:  Procedure explained and verbal consent obtained. Consent: Verbal consent obtained. Written consent not obtained. Risks and benefits: risks, benefits and alternatives were discussed.  There is also further discussed including infection, toenail loss and toenail not regrowing. Patient identity confirmed: verbally with patient and hospital-assigned identification number  Consent given by: patient   Laceration Repair Location: left great toe Length: 1 cm and toe nail Foreign bodies: no foreign bodies Tendon involvement: none Nerve involvement: none Preparation: Patient was prepped and draped in the usual sterile fashion. Digital block with 0.25% bupivacaine 4 miles. Anesthesia  with 1% Lidocaine 2 mls Irrigation solution: saline Irrigation method: jet lavage Amount of cleaning: copious Repaired with 4-0 vicryl x 3 through proximal lateral nail                         5-0 nylon x2 Technique: simple interrupted  Approximation: loose Single Steri-Strip also placed across toenail horizontally for support. Patient tolerate well. Wound well approximated post repair.  Antibiotic ointment and dressing applied. Wound care instructions provided.  Observe for any signs of infection or other problems.     INITIAL IMPRESSION / ASSESSMENT AND PLAN / ED COURSE  Pertinent labs & imaging results that were available during my care of the patient were reviewed by me and considered in my medical decision making (see chart for details).  Well-appearing patient.  No acute distress.  Presenting for evaluation of left great toe pain and nail injury after injury that occurred prior to arrival.  Tetanus immunization up-to-date.  X-ray as above per radiologist,  negative.  Toe extensively cleaned, now reinserted and secured with sutures as well as laceration repaired.  Strongly encouraged podiatry follow-up this week. Will empirically start oral Keflex and topical Bactroban. Postoperative shoe given for support. Work note given for today and tomorrow.  Discussed strict follow-up and return parameters.Discussed indication, risks and benefits of medications with patient.  Suture removal in 7 to 10 days.  Discussed follow up with Primary care physician this week. Discussed follow up and return parameters including no resolution or any worsening concerns. Patient verbalized understanding and agreed to plan.   ____________________________________________   FINAL CLINICAL IMPRESSION(S) / ED DIAGNOSES  Final diagnoses:  Contusion of left great toe with damage to nail, initial encounter  Laceration of left great toe without foreign body with damage to nail, initial encounter     ED Discharge  Orders         Ordered    cephALEXin (KEFLEX) 500 MG capsule  3 times daily     01/19/18 1600    mupirocin ointment (BACTROBAN) 2 %     01/19/18 1600           Note: This dictation was prepared with Dragon dictation along with smaller phrase technology. Any transcriptional errors that result from this process are unintentional.         Marylene Land, NP 01/19/18 [phone]

## 2018-01-19 NOTE — Discharge Instructions (Addendum)
Take medication as prescribed. Rest. Drink plenty of fluids.  Keep clean with soap and water as discussed.  Follow-up with podiatry this week as discussed.  See above to call to schedule.  Follow up with your primary care physician this week as needed. Return to Urgent care for new or worsening concerns.

## 2018-01-19 NOTE — ED Triage Notes (Signed)
Pt dropped a dining room bench on his left big toe and is actively bleeding. Cannot move his big toe.

## 2018-01-20 ENCOUNTER — Encounter: Payer: Self-pay | Admitting: Emergency Medicine

## 2018-01-20 ENCOUNTER — Ambulatory Visit
Admission: EM | Admit: 2018-01-20 | Discharge: 2018-01-20 | Disposition: A | Discharge status: Home / Self Care | Payer: Medicaid Other | Attending: Family Medicine | Admitting: Family Medicine

## 2018-01-20 ENCOUNTER — Other Ambulatory Visit: Payer: Self-pay

## 2018-01-20 DIAGNOSIS — Z4800 Encounter for change or removal of nonsurgical wound dressing: Secondary | ICD-10-CM

## 2018-01-20 DIAGNOSIS — Z5189 Encounter for other specified aftercare: Secondary | ICD-10-CM | POA: Diagnosis not present

## 2018-01-20 DIAGNOSIS — S91212D Laceration without foreign body of left great toe with damage to nail, subsequent encounter: Secondary | ICD-10-CM | POA: Diagnosis not present

## 2018-01-20 NOTE — Discharge Instructions (Addendum)
Continue keeping it clean.  Continue postoperative shoe.  Allow some open air time.  Follow-up this week with podiatry as previously directed.  Return to urgent care as needed.

## 2018-01-20 NOTE — ED Triage Notes (Signed)
Patient was seen here yesterday for toe injury. Stated he kicked the wall in his sleep and it started bleeding again.

## 2018-01-20 NOTE — ED Provider Notes (Signed)
[facility] ____________________________________________  Time seen: Approximately 2:35 PM  I have reviewed the triage vital signs and the nursing notes.   HISTORY  Chief Complaint Toe Injury   HPI Raymie Giammarco is a 22 y.o. male presenting for wound check to left great toe.  Patient was seen in urgent care after trauma to left great toe yesterday after a bench fell on it.  Reports that the part of the toenail was pulled out and reinserted and sewn in.  States he does follow directions and taking antibiotic as scribed and tolerating well.  Denies any current pain.  States mild pain only when touched.  Denies any drainage, fevers, paresthesias or pain radiation.  States here at this time for wound check as his grandmother would like it to be done.  States last night when he got home his grandfather accidentally stepped on his great toe as well as last night during the night he rolled over and accidentally hit his toe on the wall.  States when he hit it on the wall it did cause the area to bleed again.  States otherwise feels fine.  Denies any other aggravating alleviating factors.   Past Medical History:  Diagnosis Date  . GERD (gastroesophageal reflux disease)   . Skin cancer 2011   melanoma -removed by plastic surgeon- cant recall name     Patient Active Problem List   Diagnosis Date Noted  . Strain of quadriceps tendon 07/10/2016  . Thigh pain 07/10/2016  . Epididymal cyst 01/04/2015  . Cyst of epididymis 01/04/2015  . 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 Attention and concentration deficit 10/01/2014  . Attention deficit hyperactivity disorder (ADHD), predominantly inattentive type 03/15/2014  . Seasonal allergic rhinitis 03/15/2014    Past Surgical History:  Procedure Laterality Date  . caterization of nose     per pt-frquent nose bleeds   . HERNIA REPAIR     " as a baby " per pt-does not know age  . WISDOM TOOTH EXTRACTION       No current  facility-administered medications for this encounter.   Current Outpatient Medications:  .  cephALEXin (KEFLEX) 500 MG capsule, Take 1 capsule (500 mg total) by mouth 3 (three) times daily for 7 days., Disp: 21 capsule, Rfl: 0 .  mupirocin ointment (BACTROBAN) 2 %, Apply two times a day for 7 days., Disp: 22 g, Rfl: 0 .  benzonatate (TESSALON PERLES) 100 MG capsule, Take 1 capsule (100 mg total) by mouth 3 (three) times daily as needed for cough., Disp: 15 capsule, Rfl: 0  Allergies Patient has no known allergies.  Family History  Problem Relation Age of Onset  . Depression Mother   . Autoimmune disease Mother   . Cancer - Other Brother        " blood cancer " per pt    . Other Brother        "iron problem-too high "per pt (thinks )    Social History Social History   Tobacco Use  . Smoking status: Never Smoker  . Smokeless tobacco: Current User  Substance Use Topics  . Alcohol use: No  . Drug use: No    Review of Systems Constitutional: No fever/chills Cardiovascular: Denies chest pain. Respiratory: Denies shortness of breath. Musculoskeletal: Negative for back pain. Skin: as above.   ____________________________________________   PHYSICAL EXAM:  VITAL SIGNS: ED Triage Vitals  Enc Vitals Group     BP 01/20/18 1151 120/81     Pulse Rate 01/20/18  1151 82     Resp 01/20/18 1151 18     Temp 01/20/18 1151 97.9 F (36.6 C)     Temp Source 01/20/18 1151 Oral     SpO2 01/20/18 1151 99 %     Weight 01/20/18 1149 139 lb 15.9 oz (63.5 kg)     Height --      Head Circumference --      Peak Flow --      Pain Score 01/20/18 1149 2     Pain Loc --      Pain Edu? --      Excl. in [place]? --     Constitutional: Alert and oriented. Well appearing and in no acute distress. ENT      Head: Normocephalic and atraumatic. Cardiovascular: Normal rate, regular rhythm. Grossly normal heart sounds.  Good peripheral circulation. Respiratory: Normal respiratory effort without  tachypnea nor retractions. Breath sounds are clear and equal bilaterally. No wheezes, rales, rhonchi. Neurologic:  Normal speech and language. Speech is normal. No gait instability.  Skin:  Skin is warm, dry. Except:     Left great toe depicted as above, minimal tenderness at nail itself, no other tenderness noted, now remains well adhered and appears in correct alignment, sutures intact and single Steri-Strip intact,no bony tenderness, no purulence, no active bleeding.  Psychiatric: Mood and affect are normal. Speech and behavior are normal. Patient exhibits appropriate insight and judgment   ___________________________________________   LABS (all labs ordered are listed, but only abnormal results are displayed)  Labs Reviewed - No data to display ____________________________________________ RADIOLOGY  Dg Toe Great Left  Result Date: 01/19/2018 CLINICAL DATA:  Crushing injury to end of left great toe EXAM: LEFT GREAT TOE COMPARISON:  None. FINDINGS: There is no evidence of fracture or dislocation. There is no evidence of arthropathy or other focal bone abnormality. Soft tissues are unremarkable. IMPRESSION: Negative. Electronically Signed   By: Nolon Nations M.D.   On: 01/19/2018 14:54   ____________________________________________   PROCEDURES Procedures   INITIAL IMPRESSION / ASSESSMENT AND PLAN / ED COURSE  Pertinent labs & imaging results that were available during my care of the patient were reviewed by me and considered in my medical decision making (see chart for details).  Presenting for a wound check to left great toe after injury and repair yesterday.  Continues to take antibiotic oral and topical and wearing postop shoe.  Denies current pain to the area.  Presented today for wound check as the area started to bleed last night again.  No bony tenderness, no will not repeat x-ray at this time.,  Now still remains intact without any immediate complication visible.   Recommend to continue current treatment plan.  Area cleaned and irrigated by nursing staff and dressing applied.  Continue postop shoe antibiotics and follow-up with podiatry this week as planned.  Call tomorrow.  Patient states that he has a contact information and will follow these directions.Discussed indication, risks and benefits of medications with patient.  Discussed follow up and return parameters including no resolution or any worsening concerns. Patient verbalized understanding and agreed to plan.   ____________________________________________   FINAL CLINICAL IMPRESSION(S) / ED DIAGNOSES  Final diagnoses:  Visit for wound check     ED Discharge Orders    None       Note: This dictation was prepared with Dragon dictation along with smaller phrase technology. Any transcriptional errors that result from this process are unintentional.  Marylene Land, NP 01/20/18 1442

## 2018-02-04 ENCOUNTER — Encounter: Payer: Self-pay | Admitting: Emergency Medicine

## 2018-02-04 ENCOUNTER — Other Ambulatory Visit: Payer: Self-pay

## 2018-02-04 ENCOUNTER — Ambulatory Visit
Admission: EM | Admit: 2018-02-04 | Discharge: 2018-02-04 | Disposition: A | Discharge status: Home / Self Care | Payer: Medicaid Other | Attending: Family Medicine | Admitting: Family Medicine

## 2018-02-04 DIAGNOSIS — S91212D Laceration without foreign body of left great toe with damage to nail, subsequent encounter: Secondary | ICD-10-CM

## 2018-02-04 DIAGNOSIS — Z4802 Encounter for removal of sutures: Secondary | ICD-10-CM

## 2018-02-04 NOTE — ED Provider Notes (Signed)
[facility]    CSN: [id number] Arrival date & time: 02/04/18  1711   History   Chief Complaint Chief Complaint  Patient presents with  . Suture / Staple Removal   HPI  22 year old male presents for suture removal.  Patient has sutures placed on 9/1.  Healing well.  Minimal pain.  No drainage.  Patient is here today for removal.  Past Medical History:  Diagnosis Date  . GERD (gastroesophageal reflux disease)   . Skin cancer 2011   melanoma -removed by plastic surgeon- cant recall name     Patient Active Problem List   Diagnosis Date Noted  . Strain of quadriceps tendon 07/10/2016  . Thigh pain 07/10/2016  . Epididymal cyst 01/04/2015  . Cyst of epididymis 01/04/2015  . 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 Attention and concentration deficit 10/01/2014  . Attention deficit hyperactivity disorder (ADHD), predominantly inattentive type 03/15/2014  . Seasonal allergic rhinitis 03/15/2014    Past Surgical History:  Procedure Laterality Date  . caterization of nose     per pt-frquent nose bleeds   . HERNIA REPAIR     " as a baby " per pt-does not know age  . WISDOM TOOTH EXTRACTION      Home Medications    Prior to Admission medications   Medication Sig Start Date End Date Taking? Authorizing Provider  benzonatate (TESSALON PERLES) 100 MG capsule Take 1 capsule (100 mg total) by mouth 3 (three) times daily as needed for cough. 12/14/17   Marylene Land, NP  mupirocin ointment (BACTROBAN) 2 % Apply two times a day for 7 days. 01/19/18   Marylene Land, NP    Family History Family History  Problem Relation Age of Onset  . Depression Mother   . Autoimmune disease Mother   . Cancer - Other Brother        " blood cancer " per pt    . Other Brother        "iron problem-too high "per pt (thinks )    Social History Social History   Tobacco Use  . Smoking status: Never Smoker  . Smokeless tobacco: Current User  Substance Use Topics  . Alcohol use: No  .  Drug use: No     Allergies   Patient has no known allergies.   Review of Systems Review of Systems  Constitutional: Negative.   Skin: Positive for wound.   Physical Exam Triage Vital Signs ED Triage Vitals  Enc Vitals Group     BP 02/04/18 1753 124/79     Pulse Rate 02/04/18 1753 75     Resp 02/04/18 1753 14     Temp 02/04/18 1753 98.2 F (36.8 C)     Temp Source 02/04/18 1753 Oral     SpO2 02/04/18 1753 100 %     Weight 02/04/18 1752 135 lb (61.2 kg)     Height 02/04/18 1752 5\' 8"  (1.727 m)     Head Circumference --      Peak Flow --      Pain Score 02/04/18 1752 0     Pain Loc --      Pain Edu? --      Excl. in [place]? --    Updated Vital Signs BP 124/79 (BP Location: Left Arm)   Pulse 75   Temp 98.2 F (36.8 C) (Oral)   Resp 14   Ht 5\' 8"  (1.727 m)   Wt 61.2 kg   SpO2 100%   BMI 20.53 kg/m  Visual Acuity Right Eye Distance:   Left Eye Distance:   Bilateral Distance:    Right Eye Near:   Left Eye Near:    Bilateral Near:     Physical Exam  Constitutional: He appears well-developed. No distress.  Skin:  Left great toe -wound is healing well.  Sutures intact.  No drainage.  Nursing note and vitals reviewed.  UC Treatments / Results  Labs (all labs ordered are listed, but only abnormal results are displayed) Labs Reviewed - No data to display  EKG None  Radiology No results found.  Procedures Procedures (including critical care time) Sutures removed in standard fashion by nursing staff today.  Patient tolerated procedure well. Medications Ordered in UC Medications - No data to display  Initial Impression / Assessment and Plan / UC Course  I have reviewed the triage vital signs and the nursing notes.  Pertinent labs & imaging results that were available during my care of the patient were reviewed by me and considered in my medical decision making (see chart for details).     22 year old male presents for suture removal today.  Wound is  well-healed.  Sutures removed in standard fashion today.  Supportive care.  Final Clinical Impressions(s) / UC Diagnoses   Final diagnoses:  Visit for suture removal   Discharge Instructions   None    ED Prescriptions    None     Controlled Substance Prescriptions [place] Controlled Substance Registry consulted? Not Applicable   Coral Spikes, DO 02/04/18 2045

## 2018-02-04 NOTE — ED Triage Notes (Signed)
Patient here for wound check and to see if his sutures can be removed from his left 1st toe.

## 2018-06-19 ENCOUNTER — Inpatient Hospital Stay: Payer: Medicaid Other

## 2018-06-19 ENCOUNTER — Inpatient Hospital Stay: Payer: Medicaid Other | Attending: Oncology | Admitting: Oncology

## 2018-07-07 ENCOUNTER — Inpatient Hospital Stay: Payer: Medicaid Other | Attending: Oncology | Admitting: Oncology

## 2018-07-07 ENCOUNTER — Inpatient Hospital Stay: Payer: Medicaid Other

## 2018-11-23 ENCOUNTER — Other Ambulatory Visit: Payer: Self-pay

## 2018-11-23 ENCOUNTER — Encounter: Payer: Self-pay | Admitting: Emergency Medicine

## 2018-11-23 DIAGNOSIS — R05 Cough: Secondary | ICD-10-CM | POA: Insufficient documentation

## 2018-11-23 DIAGNOSIS — Z5321 Procedure and treatment not carried out due to patient leaving prior to being seen by health care provider: Secondary | ICD-10-CM | POA: Diagnosis not present

## 2018-11-23 NOTE — ED Triage Notes (Signed)
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 NAD.

## 2018-11-23 NOTE — ED Notes (Signed)
Pt waiting out front where it's warmer; awake and alert

## 2018-11-23 NOTE — ED Notes (Signed)
Pt continues to wait out front patiently for treatment room

## 2018-11-24 ENCOUNTER — Emergency Department
Admission: EM | Admit: 2018-11-24 | Discharge: 2018-11-24 | Disposition: A | Discharge status: Home / Self Care | Payer: Medicaid Other | Attending: Emergency Medicine | Admitting: Emergency Medicine

## 2019-12-28 IMAGING — DX DG FINGER MIDDLE 2+V*L*
3 series · 3 of 3 positions shown · non-contrast
Comparison: None.

CLINICAL DATA: Puncture wound with screw

EXAM:
LEFT MIDDLE FINGER 2+V

[finger ap]
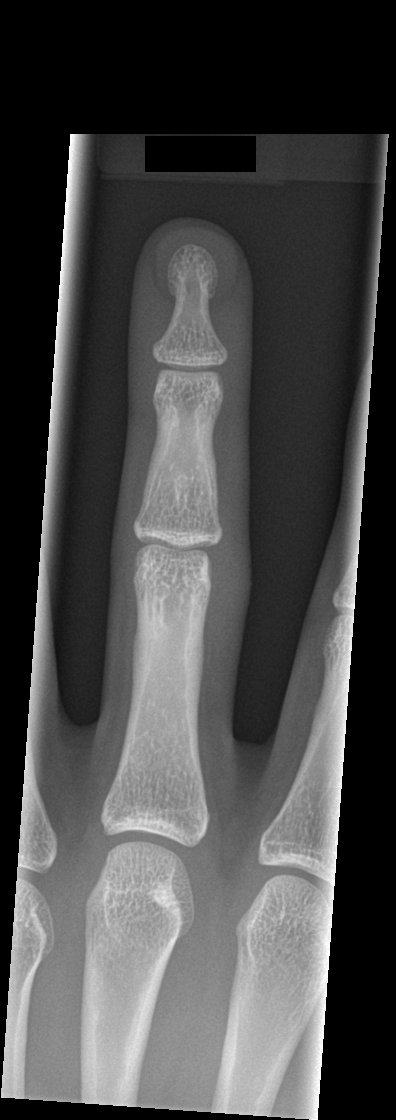

[finger obl]
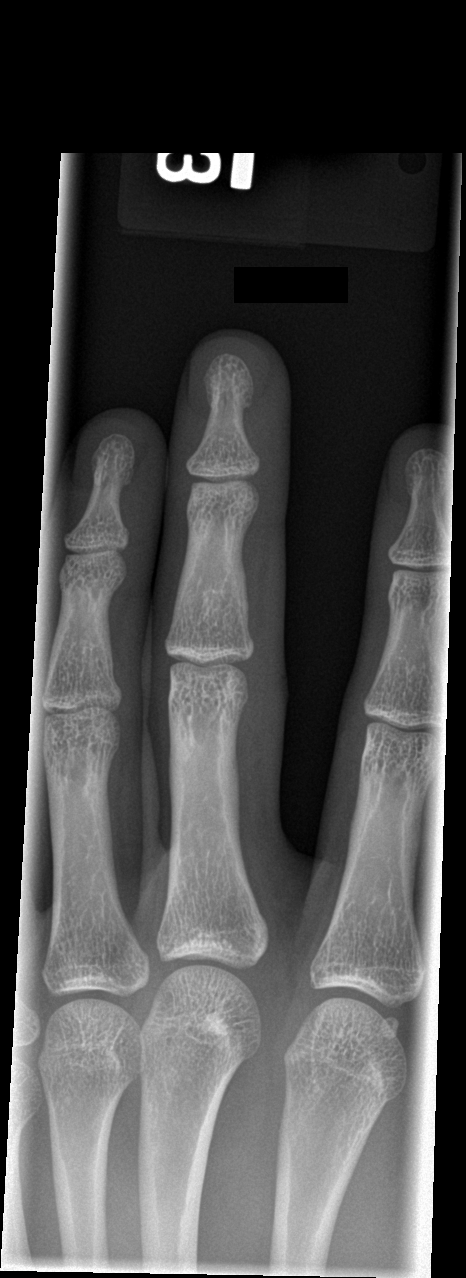

[finger lat]
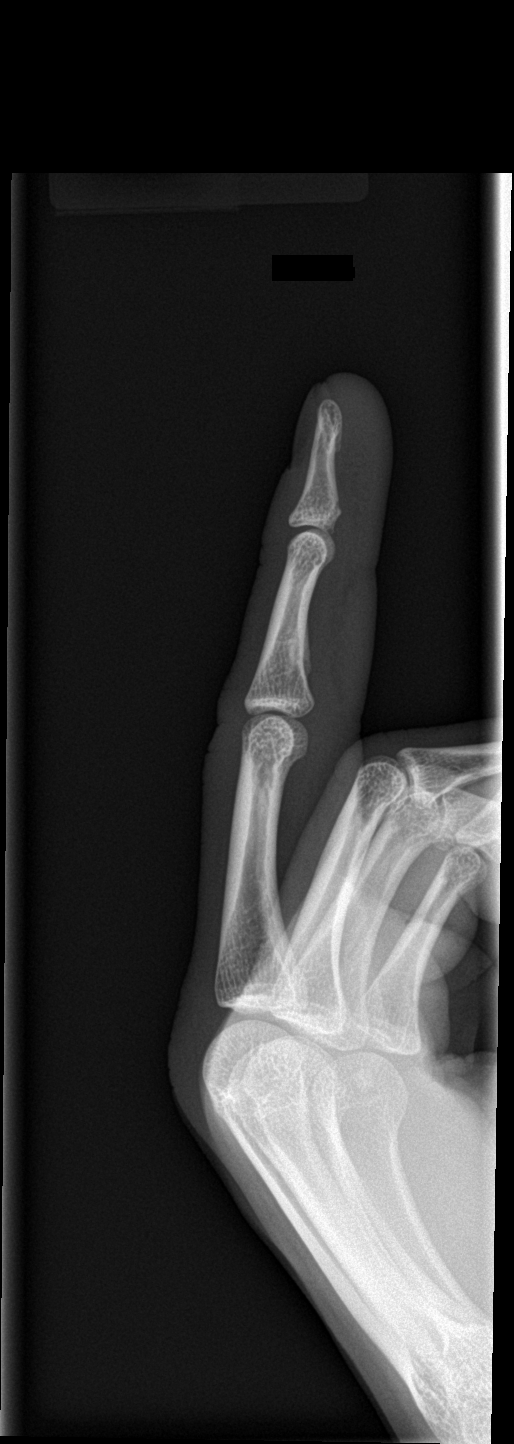

[3 of 3 positions shown; findings below may reference images not displayed]

FINDINGS: There is no evidence of fracture or dislocation. There is no
evidence of arthropathy or other focal bone abnormality. Soft
tissues are unremarkable.
IMPRESSION: Negative.

## 2020-03-10 ENCOUNTER — Other Ambulatory Visit: Payer: Medicaid Other

## 2020-03-10 ENCOUNTER — Other Ambulatory Visit: Payer: Self-pay

## 2020-03-10 DIAGNOSIS — Z20822 Contact with and (suspected) exposure to covid-19: Secondary | ICD-10-CM

## 2020-03-12 LAB — NOVEL CORONAVIRUS, NAA

## 2020-08-20 IMAGING — CR DG TOE GREAT 2+V*L*
3 series · 3 of 3 positions shown · non-contrast
Comparison: None.

CLINICAL DATA: Crushing injury to end of left great toe

EXAM:
LEFT GREAT TOE

[toe ap]
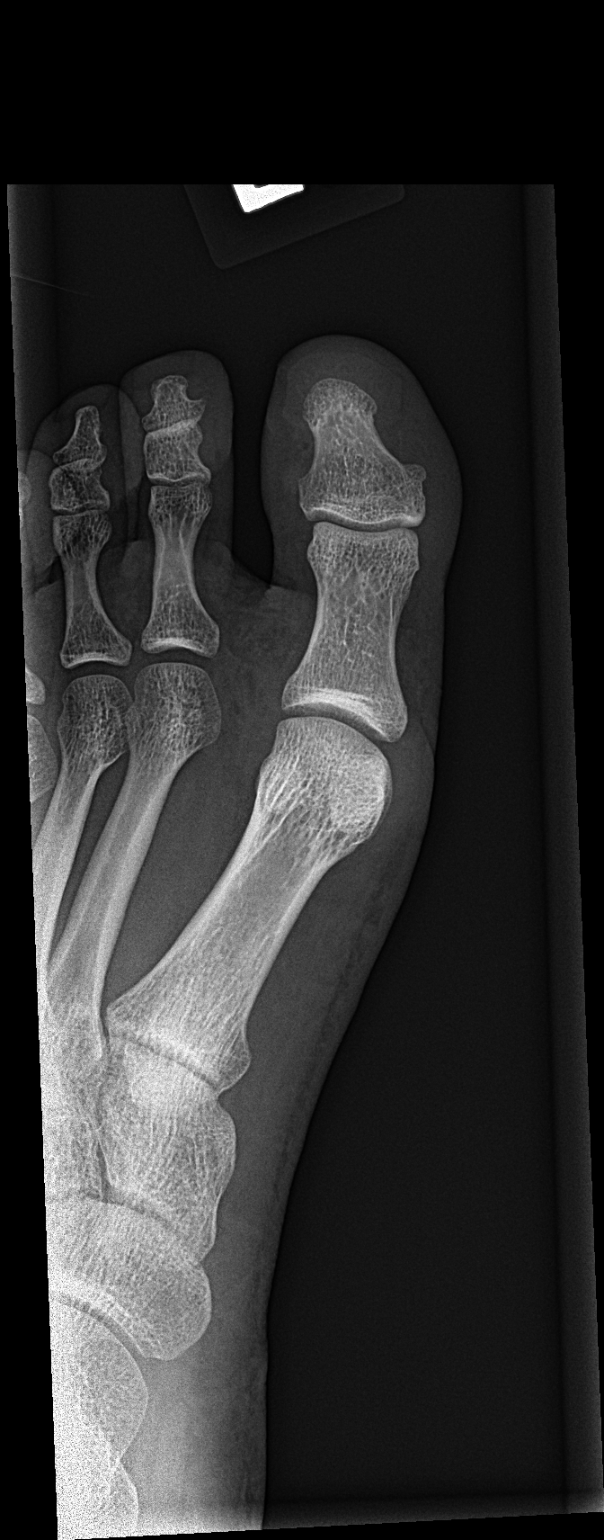

[toe obl]
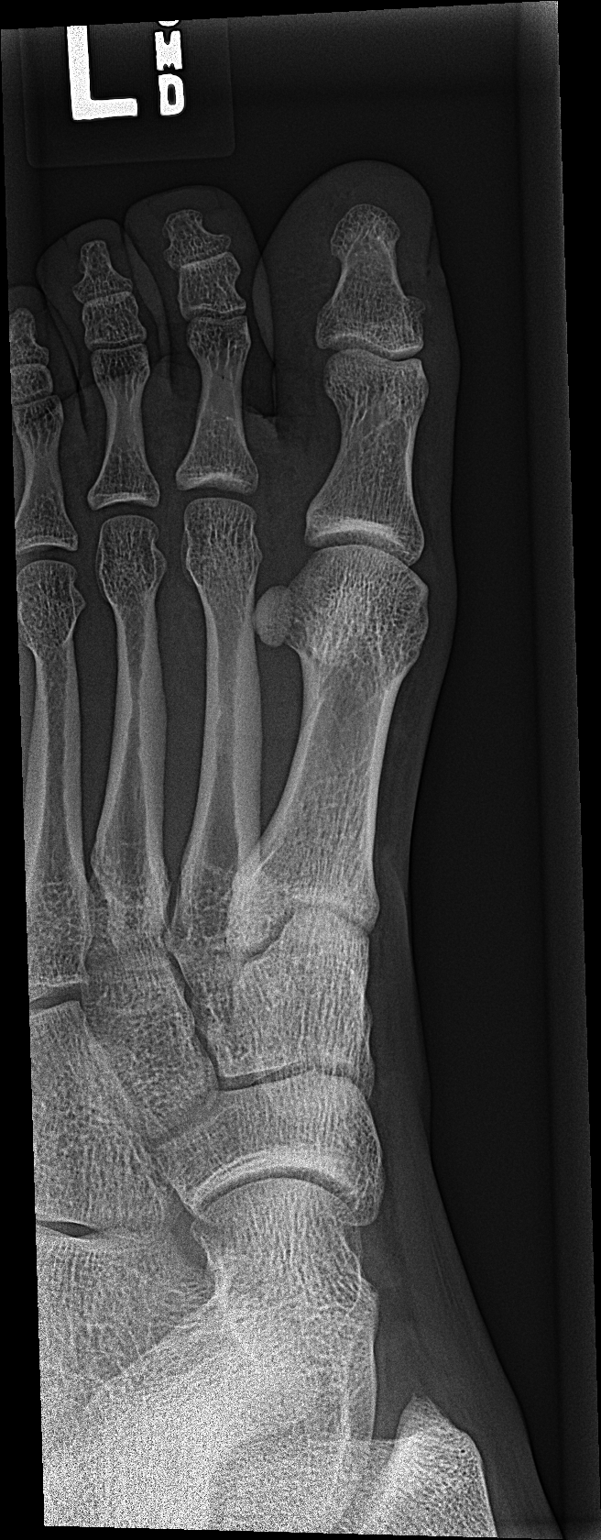

[toe lat]
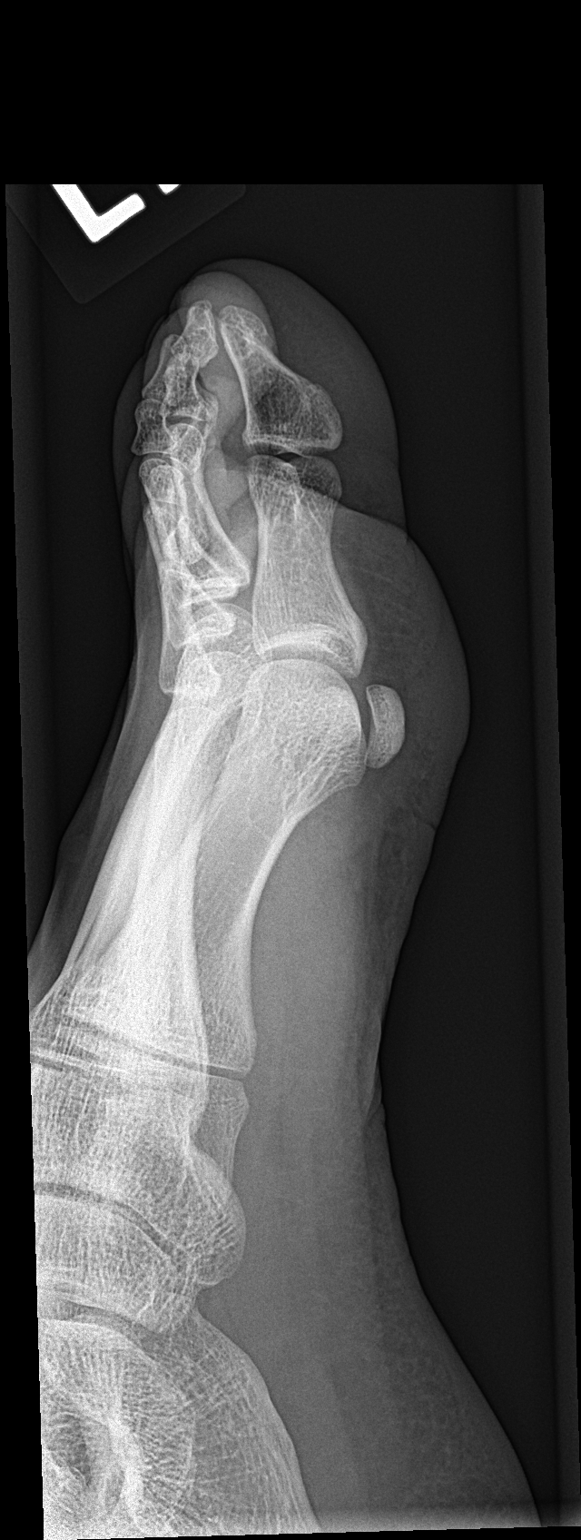

[3 of 3 positions shown; findings below may reference images not displayed]

FINDINGS: There is no evidence of fracture or dislocation. There is no
evidence of arthropathy or other focal bone abnormality. Soft
tissues are unremarkable.
IMPRESSION: Negative.

## 2021-05-18 ENCOUNTER — Ambulatory Visit: Payer: Medicaid Other | Admitting: Dermatology

## 2022-02-12 ENCOUNTER — Ambulatory Visit
Admission: EM | Admit: 2022-02-12 | Discharge: 2022-02-12 | Disposition: A | Discharge status: Home / Self Care | Payer: Medicaid Other

## 2022-02-12 DIAGNOSIS — B360 Pityriasis versicolor: Secondary | ICD-10-CM | POA: Diagnosis not present

## 2022-02-12 MED ORDER — FLUCONAZOLE 150 MG PO TABS: 300.0000 mg | ORAL_TABLET | ORAL | 0 refills | Status: AC

## 2022-02-12 NOTE — ED Provider Notes (Signed)
Roderic Palau    CSN: [id number] Arrival date & time: 02/12/22  1253      History   Chief Complaint Chief Complaint  Patient presents with   Rash    HPI Khallid Pasillas is a 26 y.o. male.    Rash   Presents to urgent care with recurrent rash that first appeared a year ago.  Current episode started in the summer while living in [place].  Worsening when he recently moved to [facility].  States rash started on his anterior abdomen now reached both arms and his back.  He has a concern for recurrence of melanoma which was diagnosed as a child.  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 Authorizing Provider  fluconazole (DIFLUCAN) 150 MG tablet Take 2 tablets (300 mg total) by mouth once a week for 2 doses. 02/12/22 02/20/22 Yes Jimena Wieczorek, Annie Main, FNP  meloxicam (MOBIC) 15 MG tablet Take 1 tablet every day by oral route. 07/10/16  Yes [provider]  benzonatate (TESSALON PERLES) 100 MG capsule Take 1 capsule (100 mg total) by mouth 3 (three) times daily as needed for cough. 12/14/17   Marylene Land, NP  clindamycin-benzoyl peroxide [facility]) gel      [provider]  mupirocin ointment (BACTROBAN) 2 % Apply two times a day for 7 days. 01/19/18   Marylene Land, NP    Family History Family History  Problem Relation Age of Onset   Depression Mother    Autoimmune disease Mother    Cancer - Other Brother        " blood cancer " per pt     Other Brother        "iron problem-too high "per pt (thinks )    Social History Social History   Tobacco Use   Smoking status: Never   Smokeless tobacco: Current  Vaping Use   Vaping Use: Some days  Substance Use Topics   Alcohol use: No   Drug use: No     Allergies   Patient has no known allergies.   Review of Systems Review of Systems  Skin:  Positive for rash.     Physical Exam Triage Vital Signs ED Triage Vitals [02/12/22 1433]  Enc Vitals Group     BP 128/86     Pulse Rate 71     Resp 16     Temp 97.7 F (36.5 C)     Temp src      SpO2 98 %     Weight      Height  Head Circumference      Peak Flow      Pain Score 0     Pain Loc      Pain Edu?      Excl. in [place]?    No data found.  Updated Vital Signs BP 128/86   Pulse 71   Temp 97.7 F (36.5 C)   Resp 16   SpO2 98%   Visual Acuity Right Eye Distance:   Left Eye Distance:   Bilateral Distance:    Right Eye Near:   Left Eye Near:    Bilateral Near:     Physical Exam Vitals reviewed.  Constitutional:      Appearance: Normal appearance.  Skin:    General: Skin is warm and dry.     Findings: Rash present.     Comments: Disseminated patches of erythematous/hyperpigmented skin.  Some dry and flaky is present.  Not raised.  Denies pruritus.  Borders of lesions are generally round or overall but somewhat uneven .  Neurological:     General: No focal deficit present.     Mental Status: He is alert and oriented to person, place, and time.  Psychiatric:        Mood and Affect: Mood normal.        Behavior: Behavior normal.      UC Treatments / Results  Labs (all labs ordered are  listed, but only abnormal results are displayed) Labs Reviewed - No data to display  EKG   Radiology No results found.  Procedures Procedures (including critical care time)  Medications Ordered in UC Medications - No data to display  Initial Impression / Assessment and Plan / UC Course  I have reviewed the triage vital signs and the nursing notes.  Pertinent labs & imaging results that were available during my care of the patient were reviewed by me and considered in my medical decision making (see chart for details).   Tinea versicolor.  Will treat with fluconazole x2 weeks per protocol.  If symptoms do not improve, patient should return for alternative treatment.  Discussed expected clinical course as feeding color over possibly months.  If symptoms recur, should return for retreatment.   Final Clinical Impressions(s) / UC Diagnoses   Final diagnoses:  Tinea versicolor     Discharge Instructions      Follow-up here or with your primary care provider if your symptoms do not resolve with treatment.   ED Prescriptions     Medication Sig Dispense Auth. Provider   fluconazole (DIFLUCAN) 150 MG tablet Take 2 tablets (300 mg total) by mouth once a week for 2 doses. 4 tablet Garland Smouse, FNP      PDMP not reviewed this encounter.   Rose Phi, [place] 02/12/22 1452

## 2022-02-12 NOTE — Discharge Instructions (Addendum)
You should expect the rash to fade over a course of weeks to months.  If it does not improve then, follow-up here or with your primary care provider if your symptoms do not improve with treatment.  If it recurs, follow-up here or with your primary care provider.

## 2022-02-12 NOTE — ED Triage Notes (Signed)
Pt. States that she has a rash that appeared a year ago. States it started on his abdomen and now has reached both arms and his back. Pt. States as a child he has been diagnosed w/melanoma and is concerned for a reoccurrence.

## 2022-08-10 ENCOUNTER — Ambulatory Visit: Payer: Medicaid Other | Admitting: Nurse Practitioner

## 2022-08-14 ENCOUNTER — Ambulatory Visit: Payer: Medicaid Other | Admitting: Nurse Practitioner

## 2022-08-22 ENCOUNTER — Ambulatory Visit: Payer: Medicaid Other | Admitting: Nurse Practitioner

## 2022-08-24 ENCOUNTER — Ambulatory Visit: Payer: Medicaid Other | Admitting: Nurse Practitioner

## 2022-08-27 ENCOUNTER — Ambulatory Visit: Payer: Medicaid Other | Admitting: Nurse Practitioner
